# Patient Record
Sex: Male | Born: 1985 | Race: Black or African American | Hispanic: No | Marital: Single | Smoking: Current every day smoker
Health system: Southern US, Community
[De-identification: ages and names within clinical notes are randomized; demographics above are authoritative.]

## PROBLEM LIST (undated history)

## (undated) DIAGNOSIS — K219 Gastro-esophageal reflux disease without esophagitis: Secondary | ICD-10-CM

---

## 2013-12-06 ENCOUNTER — Encounter (HOSPITAL_COMMUNITY): Payer: Self-pay | Admitting: Emergency Medicine

## 2013-12-06 ENCOUNTER — Emergency Department (HOSPITAL_COMMUNITY)
Admission: EM | Admit: 2013-12-06 | Discharge: 2013-12-07 | Disposition: A | Discharge status: Home / Self Care | Payer: 59 | Attending: Emergency Medicine | Admitting: Emergency Medicine

## 2013-12-06 ENCOUNTER — Emergency Department (HOSPITAL_COMMUNITY)
Admission: EM | Admit: 2013-12-06 | Discharge: 2013-12-06 | Disposition: A | Discharge status: Home / Self Care | Payer: 59 | Source: Home / Self Care | Attending: Emergency Medicine | Admitting: Emergency Medicine

## 2013-12-06 DIAGNOSIS — R270 Ataxia, unspecified: Secondary | ICD-10-CM

## 2013-12-06 DIAGNOSIS — R109 Unspecified abdominal pain: Secondary | ICD-10-CM | POA: Diagnosis not present

## 2013-12-06 DIAGNOSIS — R1110 Vomiting, unspecified: Secondary | ICD-10-CM

## 2013-12-06 DIAGNOSIS — R1084 Generalized abdominal pain: Secondary | ICD-10-CM

## 2013-12-06 LAB — COMPREHENSIVE METABOLIC PANEL
ALBUMIN: 4.4 g/dL (ref 3.5–5.2)
ALK PHOS: 96 U/L (ref 39–117)
ALT: 20 U/L (ref 0–53)
ANION GAP: 14 (ref 5–15)
AST: 21 U/L (ref 0–37)
BILIRUBIN TOTAL: 1.5 mg/dL — AB (ref 0.3–1.2)
BUN: 7 mg/dL (ref 6–23)
CHLORIDE: 100 meq/L (ref 96–112)
CO2: 25 mEq/L (ref 19–32)
Calcium: 9.9 mg/dL (ref 8.4–10.5)
Creatinine, Ser: 0.74 mg/dL (ref 0.50–1.35)
GFR calc Af Amer: >90 mL/min (ref >90)
GFR calc non Af Amer: >90 mL/min (ref >90)
GLUCOSE: 91 mg/dL (ref 70–99)
POTASSIUM: 4.3 meq/L (ref 3.7–5.3)
SODIUM: 139 meq/L (ref 137–147)
TOTAL PROTEIN: 8.2 g/dL (ref 6.0–8.3)

## 2013-12-06 LAB — POCT I-STAT, CHEM 8
BUN: 5 mg/dL — ABNORMAL LOW (ref 6–23)
Calcium, Ion: 1.17 mmol/L (ref 1.12–1.23)
Chloride: 102 mEq/L (ref 96–112)
Creatinine, Ser: 0.8 mg/dL (ref 0.50–1.35)
Glucose, Bld: 88 mg/dL (ref 70–99)
HEMATOCRIT: 53 % — AB (ref 39.0–52.0)
HEMOGLOBIN: 18 g/dL — AB (ref 13.0–17.0)
POTASSIUM: 3.8 meq/L (ref 3.7–5.3)
SODIUM: 137 meq/L (ref 137–147)
TCO2: 28 mmol/L (ref 0–100)

## 2013-12-06 LAB — URINALYSIS, ROUTINE W REFLEX MICROSCOPIC
BILIRUBIN URINE: NEGATIVE
Glucose, UA: NEGATIVE mg/dL
HGB URINE DIPSTICK: NEGATIVE
Ketones, ur: 15 mg/dL — AB
Leukocytes, UA: NEGATIVE
Nitrite: NEGATIVE
Protein, ur: NEGATIVE mg/dL
SPECIFIC GRAVITY, URINE: 1.022 (ref 1.005–1.030)
Urobilinogen, UA: 0.2 mg/dL (ref 0.0–1.0)
pH: 7 (ref 5.0–8.0)

## 2013-12-06 LAB — CBC WITH DIFFERENTIAL/PLATELET
Basophils Absolute: 0 10*3/uL (ref 0.0–0.1)
Basophils Relative: 0 % (ref 0–1)
EOS ABS: 0 10*3/uL (ref 0.0–0.7)
Eosinophils Relative: 0 % (ref 0–5)
HCT: 47.1 % (ref 39.0–52.0)
HEMOGLOBIN: 16.3 g/dL (ref 13.0–17.0)
Lymphocytes Relative: 17 % (ref 12–46)
Lymphs Abs: 1.4 10*3/uL (ref 0.7–4.0)
MCH: 28.3 pg (ref 26.0–34.0)
MCHC: 34.6 g/dL (ref 30.0–36.0)
MCV: 81.8 fL (ref 78.0–100.0)
Monocytes Absolute: 0.5 10*3/uL (ref 0.1–1.0)
Monocytes Relative: 7 % (ref 3–12)
NEUTROS PCT: 76 % (ref 43–77)
Neutro Abs: 6.3 10*3/uL (ref 1.7–7.7)
PLATELETS: 252 10*3/uL (ref 150–400)
RBC: 5.76 MIL/uL (ref 4.22–5.81)
RDW: 13.4 % (ref 11.5–15.5)
WBC: 8.2 10*3/uL (ref 4.0–10.5)

## 2013-12-06 LAB — LIPASE, BLOOD: Lipase: 25 U/L (ref 11–59)

## 2013-12-06 MED ORDER — ONDANSETRON HCL 4 MG/2ML IJ SOLN
4.0000 mg | Freq: Once | INTRAMUSCULAR | Status: AC
  Administered 2013-12-06: 4 mg via INTRAVENOUS
  Filled 2013-12-06: qty 2

## 2013-12-06 MED ORDER — ONDANSETRON 4 MG PO TBDP
8.0000 mg | ORAL_TABLET | Freq: Once | ORAL | Status: AC
  Administered 2013-12-06: 8 mg via ORAL

## 2013-12-06 MED ORDER — GI COCKTAIL ~~LOC~~
30.0000 mL | Freq: Once | ORAL | Status: AC
  Administered 2013-12-06: 30 mL via ORAL
  Filled 2013-12-06: qty 30

## 2013-12-06 MED ORDER — ONDANSETRON 4 MG PO TBDP
ORAL_TABLET | ORAL | Status: AC
  Filled 2013-12-06: qty 1

## 2013-12-06 MED ORDER — SODIUM CHLORIDE 0.9 % IV BOLUS (SEPSIS)
1000.0000 mL | Freq: Once | INTRAVENOUS | Status: AC
  Administered 2013-12-06: 1000 mL via INTRAVENOUS

## 2013-12-06 NOTE — ED Notes (Addendum)
Pt sent here from [facility] for further eval of generalized abd pain. Having n/v denies diarrhea. Denies fever. Pt reports feeling drowsy and "drunk." pt reports pain started on Monday after taking over the recommended dosage of zzquil. Also having constipation. No acute distress noted at triage.

## 2013-12-06 NOTE — Discharge Instructions (Signed)
We have determined that your problem requires further evaluation in the emergency department.  We will take care of your transport there.  Once at the emergency department, you will be evaluated by a provider and they will order whatever treatment or tests they deem necessary.  We cannot guarantee that they will do any specific test or do any specific treatment.  ° °

## 2013-12-06 NOTE — ED Provider Notes (Signed)
Chief Complaint   Abdominal Pain   History of Present Illness   Donald Wagner is a 28 year old male who has had a five-day history of disequilibrium especially when he walks, nausea, vomiting, abdominal pain. The patient states that he has had insomnia has been taking Z Quill every night. He states that Monday he took a particularly large dose and has not taken any since then. Ever since then he's felt off balance, unsteady on his feet when he walks and like he is drunk. He denies any dizziness or vertigo. He's had no presyncope or syncope. The patient states that since Monday he's felt nauseated and vomited up everything he has eaten. He's also had multiple episodes of dry heaves and retching. Some of the vomitus looked red in color and he denies any bilious vomiting or coffee-ground emesis. Also since Monday the patient has had moderate intensity abdominal pain localized mostly to the left flank and right upper quadrant. He denies diarrhea, and fact he states his stools have been infrequent. He's tried some laxatives without much improvement. He has felt hot and sweaty. The entire left side of his body feels heavy but he denies any weakness or paresthesias. He's had no difficulty speaking or swallowing. He's had slight headache, light sensitivity, and blurry vision. He denies any diplopia. He's had some cough and feels somewhat short of breath at times. He denies any chest pain.  Review of Systems     Other than as noted above, the patient denies any of the following symptoms: Systemic:  No fever, chills, fatigue, myalgias, headache, or anorexia. Eye:  No redness, pain or drainage. ENT:  No earache, nasal congestion, rhinorrhea, sinus pressure, or sore throat. Lungs:  No cough, sputum production, wheezing, shortness of breath.  Cardiovascular:  No chest pain, palpitations, or syncope. GI:  No nausea, vomiting, abdominal pain or diarrhea. GU:  No dysuria, frequency, or hematuria. Skin:   No rash or pruritis.   PMFSH     Past medical history, family history, social history, meds, and allergies were reviewed.    Physical Examination    Vital signs:  BP 126/86  Pulse 80  Temp(Src) 98.1 F (36.7 C) (Oral)  Resp 16  SpO2 98% Orthostatic VS for the past 24 hrs:  BP- Lying Pulse- Lying BP- Sitting Pulse- Sitting BP- Standing at 0 minutes Pulse- Standing at 0 minutes  12/06/13 1738 139/83 mmHg 69 126/89 mmHg 68 130/91 mmHg 75   General:  Alert, in no distress. Eye:  PERRL, full EOMs.  Lids and conjunctivas were normal. ENT:  TMs and canals were normal, without erythema or inflammation.  Nasal mucosa was clear and uncongested, without drainage.  Mucous membranes were moist.  Pharynx was clear, without exudate or drainage.  There were no oral ulcerations or lesions. Neck:  Supple, no adenopathy, tenderness or mass. Thyroid was normal. Lungs:  No respiratory distress.  Lungs were clear to auscultation, without wheezes, rales or rhonchi.  Breath sounds were clear and equal bilaterally. Heart:  Regular rhythm, without gallops, murmers or rubs. Abdomen:  Soft, flat, with generalized tenderness to palpation without guarding or rebound.  No hepatosplenomagaly or mass. Bowel sounds were diminished. Skin:  Clear, warm, and dry, he has a petechial rash on his face, Neurological examination: The patient is alert and oriented x3. Speech is clear, fluent, and appropriate. Cranial nerves are intact. There is no pronator drift and finger to nose was normal. Muscle strength, sensation, and DTRs are normal. Babinskis are downgoing.  Station and gait were normal. Romberg sign is negative, he is unsteady on performance of tendon gait.   Labs    Results for orders placed during the hospital encounter of 12/06/13  POCT I-STAT, CHEM 8      Result Value Ref Range   Sodium 137  137 - 147 mEq/L   Potassium 3.8  3.7 - 5.3 mEq/L   Chloride 102  96 - 112 mEq/L   BUN 5 (*) 6 - 23 mg/dL   Creatinine,  Ser 1.610.80  0.50 - 1.35 mg/dL   Glucose, Bld 88  70 - 99 mg/dL   Calcium, Ion 0.961.17  0.451.12 - 1.23 mmol/L   TCO2 28  0 - 100 mmol/L   Hemoglobin 18.0 (*) 13.0 - 17.0 g/dL   HCT 40.953.0 (*) 81.139.0 - 91.452.0 %    Course in Urgent Care Center   The following medications were given:  Medications  ondansetron (ZOFRAN-ODT) disintegrating tablet 8 mg (8 mg Oral Given 12/06/13 1737)   Assessment   The primary encounter diagnosis was Intractable vomiting with nausea, vomiting of unspecified type. Diagnoses of Abdominal pain, generalized and Ataxia were also pertinent to this visit.  Differential diagnosis includes GI symptoms such as viral gastroenteritis, inflammatory bowel disease, diverticulitis, or pancreatitis. Also to be considered or neurological symptoms since he has prominent ataxia and is unsteady on tandem gait.  Plan     The patient was transferred to the ED via private vehicle in stable condition.  Medical Decision Making:  28 year old male with a five-day history of nausea and vomiting of all by mouth intake, generalized abdominal pain, ataxia, and his sensation in his left side feeling heavy. Physical examination is unremarkable with exception of a petechial rash on his face, generalized abdominal tenderness, and ataxia on tandem gait. Lab workup reveals a normal i-STAT 8 and normal orthostatic vital signs. Differential diagnosis is broad and you require further evaluation.         Donald Likesavid C Nazire Fruth, MD 12/06/13 [id number]

## 2013-12-06 NOTE — ED Notes (Signed)
C/o abd pain onset Monday Sx include hot flashes and vomiting Denies fevers and diarrhea Alert, no signs of acute distress.

## 2013-12-06 NOTE — ED Notes (Signed)
Declined shuttle service Mom will take pt Adv pt to go directly; NPO... Pt verbalized understanding.

## 2013-12-07 ENCOUNTER — Emergency Department (HOSPITAL_COMMUNITY): Payer: 59

## 2013-12-07 MED ORDER — ONDANSETRON 4 MG PO TBDP: 4.0000 mg | ORAL_TABLET | Freq: Three times a day (TID) | ORAL | Status: DC | PRN

## 2013-12-07 MED ORDER — OMEPRAZOLE 40 MG PO CPDR: 40.0000 mg | DELAYED_RELEASE_CAPSULE | Freq: Every day | ORAL | Status: DC

## 2013-12-07 NOTE — Discharge Instructions (Signed)

## 2013-12-07 NOTE — ED Notes (Signed)
Pt given water for a fluid challenge. 

## 2013-12-07 NOTE — ED Provider Notes (Signed)
TIME SEEN: 10:15 PM  CHIEF COMPLAINT: Abdominal pain, vomiting  HPI: Patient is a 28 y.o. M with no significant past medical history who presents to the emergency department with vomiting, abdominal fullness and feeling drowsy. Patient reports that he regularly takes Benadryl to help him sleep. He has been taking extra Benadryl over the past several days and states that he has been feeling more drowsy over the past few days. He states because of this he stopped taking the Benadryl but history as he has has not improved yet. He denies any other new ingestions. He is also had abdominal pain that he describes as feeling bloated and distended. He states that he feels very full. He has had multiple episodes of vomiting but no diarrhea. His last bowel movement was this morning and was normal. No bloody stools or melena. No fevers or chills. No sick contacts or recent travel. No antibiotic use. He states he has been vomiting forcefully recently and has facial petechiae secondary to this. No headache, neck pain or neck stiffness. No other rash.  ROS: See HPI Constitutional: no fever  Eyes: no drainage  ENT: no runny nose   Cardiovascular:  no chest pain  Resp: no SOB  GI:  vomiting GU: no dysuria Integumentary: no rash  Allergy: no hives  Musculoskeletal: no leg swelling  Neurological: no slurred speech ROS otherwise negative  PAST MEDICAL HISTORY/PAST SURGICAL HISTORY:  History reviewed. No pertinent past medical history.  MEDICATIONS:  Prior to Admission medications   Not on File    ALLERGIES:  No Known Allergies  SOCIAL HISTORY:  History  Substance Use Topics  . Smoking status: Never Smoker   . Smokeless tobacco: Not on file  . Alcohol Use: No    FAMILY HISTORY: History reviewed. No pertinent family history.  EXAM: BP 109/56  Pulse 57  Temp(Src) 97.9 F (36.6 C) (Oral)  Resp 16  SpO2 100% CONSTITUTIONAL: Alert and oriented and responds appropriately to questions.  Well-appearing; well-nourished, smiling, laughing, joking HEAD: Normocephalic EYES: Conjunctivae clear, PERRL ENT: normal nose; no rhinorrhea; moist mucous membranes; pharynx without lesions noted; petechiae noted on his face, no subconjunctival hemorrhage NECK: Supple, no meningismus, no LAD  CARD: RRR; S1 and S2 appreciated; no murmurs, no clicks, no rubs, no gallops RESP: Normal chest excursion without splinting or tachypnea; breath sounds clear and equal bilaterally; no wheezes, no rhonchi, no rales,  ABD/GI: Normal bowel sounds; non-distended; soft, non-tender, no rebound, no guarding; no peritoneal signs, negative Murphy sign, no tenderness at McBurney's point BACK:  The back appears normal and is non-tender to palpation, there is no CVA tenderness EXT: Normal ROM in all joints; non-tender to palpation; no edema; normal capillary refill; no cyanosis    SKIN: Normal color for age and race; warm; petechiae on his face but no other rash, no lesions on his palms or soles, no purpura NEURO: Moves all extremities equally PSYCH: The patient's mood and manner are appropriate. Grooming and personal hygiene are appropriate.  MEDICAL DECISION MAKING: Patient here with vomiting, feeling full and bloated. His abdomen is not distended. There is no tympany or fluid wave. His abdominal exam is benign. Labs ordered in triage are unremarkable. He has no thrombocytopenia. No electrolyte abnormality. LFTs show mildly elevated total bilirubin but otherwise unremarkable. Lipase normal. Urine shows no sign of infection. Will obtain acute abdominal series given his complaints of feeling full to evaluate for constipation, less likely small bowel obstruction. Will give GI cocktail, Zofran, IV fluids and  by mouth challenge.  ED PROGRESS: Patient's acute abdominal series shows no obstructive gas pattern, no free air. He is feeling much better after GI cocktail and Zofran. He states however he is still feeling drowsy.  Discussed with him this may be residual from recent increase in Benadryl intake. Discussed with him I recommend not taking this medication for several days. He has no anemia or electrolyte abnormality that could be causing the symptoms. He has been tolerating by mouth without difficulty. He reports abdominal pain and vomiting improved. No further vomiting in the ED. Will discharge with prescriptions for omeprazole and Zofran. Discussed supportive care instructions and return precautions. He verbalized understanding and is comfortable with plan.     Layla MawKristen N Leveda Kendrix, DO 12/07/13 [phone]

## 2015-07-02 ENCOUNTER — Emergency Department (HOSPITAL_COMMUNITY)
Admission: EM | Admit: 2015-07-02 | Discharge: 2015-07-02 | Disposition: A | Discharge status: Home / Self Care | Payer: Self-pay | Attending: Emergency Medicine | Admitting: Emergency Medicine

## 2015-07-02 ENCOUNTER — Encounter (HOSPITAL_COMMUNITY): Payer: Self-pay | Admitting: *Deleted

## 2015-07-02 ENCOUNTER — Emergency Department (HOSPITAL_COMMUNITY): Payer: Self-pay

## 2015-07-02 DIAGNOSIS — R1033 Periumbilical pain: Secondary | ICD-10-CM

## 2015-07-02 DIAGNOSIS — Z79899 Other long term (current) drug therapy: Secondary | ICD-10-CM | POA: Insufficient documentation

## 2015-07-02 DIAGNOSIS — K219 Gastro-esophageal reflux disease without esophagitis: Secondary | ICD-10-CM | POA: Insufficient documentation

## 2015-07-02 DIAGNOSIS — K429 Umbilical hernia without obstruction or gangrene: Secondary | ICD-10-CM | POA: Insufficient documentation

## 2015-07-02 HISTORY — DX: Gastro-esophageal reflux disease without esophagitis: K21.9

## 2015-07-02 LAB — CBC
HCT: 47.5 % (ref 39.0–52.0)
HEMOGLOBIN: 16.3 g/dL (ref 13.0–17.0)
MCH: 27.7 pg (ref 26.0–34.0)
MCHC: 34.3 g/dL (ref 30.0–36.0)
MCV: 80.8 fL (ref 78.0–100.0)
PLATELETS: 292 10*3/uL (ref 150–400)
RBC: 5.88 MIL/uL — AB (ref 4.22–5.81)
RDW: 13.4 % (ref 11.5–15.5)
WBC: 16.4 10*3/uL — ABNORMAL HIGH (ref 4.0–10.5)

## 2015-07-02 LAB — COMPREHENSIVE METABOLIC PANEL
ALBUMIN: 4.2 g/dL (ref 3.5–5.0)
ALK PHOS: 85 U/L (ref 38–126)
ALT: 33 U/L (ref 17–63)
AST: 32 U/L (ref 15–41)
Anion gap: 12 (ref 5–15)
BUN: 10 mg/dL (ref 6–20)
CALCIUM: 10.1 mg/dL (ref 8.9–10.3)
CHLORIDE: 101 mmol/L (ref 101–111)
CO2: 21 mmol/L — AB (ref 22–32)
CREATININE: 1.05 mg/dL (ref 0.61–1.24)
GFR calc Af Amer: >60 mL/min (ref >60)
GFR calc non Af Amer: >60 mL/min (ref >60)
GLUCOSE: 155 mg/dL — AB (ref 65–99)
Potassium: 4.4 mmol/L (ref 3.5–5.1)
SODIUM: 134 mmol/L — AB (ref 135–145)
Total Bilirubin: 1 mg/dL (ref 0.3–1.2)
Total Protein: 8 g/dL (ref 6.5–8.1)

## 2015-07-02 LAB — LIPASE, BLOOD: LIPASE: 24 U/L (ref 11–51)

## 2015-07-02 LAB — URINALYSIS, ROUTINE W REFLEX MICROSCOPIC
Bilirubin Urine: NEGATIVE
GLUCOSE, UA: NEGATIVE mg/dL
HGB URINE DIPSTICK: NEGATIVE
Ketones, ur: NEGATIVE mg/dL
Leukocytes, UA: NEGATIVE
Nitrite: NEGATIVE
Protein, ur: NEGATIVE mg/dL
pH: 7 (ref 5.0–8.0)

## 2015-07-02 MED ORDER — METOCLOPRAMIDE HCL 5 MG/ML IJ SOLN
5.0000 mg | Freq: Once | INTRAMUSCULAR | Status: AC
  Administered 2015-07-02: 5 mg via INTRAVENOUS
  Filled 2015-07-02: qty 2

## 2015-07-02 MED ORDER — HYDROMORPHONE HCL 1 MG/ML IJ SOLN
1.0000 mg | Freq: Once | INTRAMUSCULAR | Status: AC
Start: 2015-07-02 — End: 2015-07-02
  Administered 2015-07-02: 1 mg via INTRAVENOUS
  Filled 2015-07-02: qty 1

## 2015-07-02 MED ORDER — IOPAMIDOL (ISOVUE-300) INJECTION 61%
INTRAVENOUS | Status: AC
  Administered 2015-07-02: 100 mL
  Filled 2015-07-02: qty 100

## 2015-07-02 MED ORDER — SODIUM CHLORIDE 0.9 % IV BOLUS (SEPSIS)
2000.0000 mL | Freq: Once | INTRAVENOUS | Status: AC
  Administered 2015-07-02: 2000 mL via INTRAVENOUS

## 2015-07-02 NOTE — ED Provider Notes (Signed)
CSN: [id number]     Arrival date & time 07/02/15  [id number] History   First MD Initiated Contact with Patient 07/02/15 0930     Chief Complaint  Patient presents with  . Abdominal Pain     (Consider location/radiation/quality/duration/timing/severity/associated sxs/prior Treatment) HPI Complains of periumbilical abdominal pain gradual onset 2 AM today nonradiating, severe and sharp. Associated symptoms include nausea and vomiting 4 episodes since onset of pain though not nauseated at present. Last bowel movement 8 AM today, normal. No known fever. No urinary symptoms. No other associated symptoms. Treated with Prilosec, without relief. Past Medical History  Diagnosis Date  . Acid reflux    History reviewed. No pertinent past surgical history. History reviewed. No pertinent family history. Past surgical history negative Social History  Substance Use Topics  . Smoking status: Never Smoker   . Smokeless tobacco: None  . Alcohol Use: No  Cigar smoker, occasional alcohol no illicit drug use  Review of Systems  Constitutional: Negative.   HENT: Negative.   Respiratory: Negative.   Cardiovascular: Negative.   Gastrointestinal: Positive for nausea, vomiting and abdominal pain.  Musculoskeletal: Negative.   Skin: Negative.   Neurological: Negative.   Psychiatric/Behavioral: Negative.   All other systems reviewed and are negative.     Allergies  Bee venom  Home Medications   Prior to Admission medications   Medication Sig Start Date End Date Taking? Authorizing Provider  omeprazole (PRILOSEC) 40 MG capsule Take 1 capsule (40 mg total) by mouth daily. 12/07/13  Yes Kristen N Ward, DO  ondansetron (ZOFRAN ODT) 4 MG disintegrating tablet Take 1 tablet (4 mg total) by mouth every 8 (eight) hours as needed for nausea or vomiting. 12/07/13   Kristen N Ward, DO   BP 146/97 mmHg  Pulse 92  Temp(Src) 97.9 F (36.6 C)  Resp 21  Ht 5\' 8"  (1.727 m)  Wt 275 lb (124.739 kg)  BMI 41.82  kg/m2  SpO2 99% Physical Exam  Constitutional: He appears well-developed and well-nourished. He appears distressed.  Appears uncomfortable  HENT:  Head: Normocephalic and atraumatic.  Eyes: Conjunctivae are normal. Pupils are equal, round, and reactive to light.  Neck: Neck supple. No tracheal deviation present. No thyromegaly present.  Cardiovascular: Normal rate and regular rhythm.   No murmur heard. Pulmonary/Chest: Effort normal and breath sounds normal.  Abdominal: Soft. Bowel sounds are normal. He exhibits no distension and no mass. There is tenderness.  Obese Periumbilical tenderness  Genitourinary: Penis normal.  Scrotum normal  Musculoskeletal: Normal range of motion. He exhibits no edema or tenderness.  Neurological: He is alert. Coordination normal.  Skin: Skin is warm and dry. No rash noted.  Psychiatric: He has a normal mood and affect.  Nursing note and vitals reviewed.   ED Course  Procedures (including critical care time) Labs Review Labs Reviewed  LIPASE, BLOOD  COMPREHENSIVE METABOLIC PANEL  CBC  URINALYSIS, ROUTINE W REFLEX MICROSCOPIC (NOT AT [facility])    Imaging Review No results found. I have personally reviewed and evaluated these images and lab results as part of my medical decision-making.   EKG Interpretation None     12:20 PM patient resting comfortably after treatment with intravenous fluids, intravenous Reglan and intravenous opioids. Abdomen is soft and nontender. Reexamined abdomen. No external evidence of hernia  1:50 PM pain almost gone. He denies any nausea. Results for orders placed or performed during the hospital encounter of 07/02/15  Lipase, blood  Result Value Ref Range   Lipase 24 11 -  51 U/L  Comprehensive metabolic panel  Result Value Ref Range   Sodium 134 (L) 135 - 145 mmol/L   Potassium 4.4 3.5 - 5.1 mmol/L   Chloride 101 101 - 111 mmol/L   CO2 21 (L) 22 - 32 mmol/L   Glucose, Bld 155 (H) 65 - 99 mg/dL   BUN 10 6 - 20  mg/dL   Creatinine, Ser 1.61 0.61 - 1.24 mg/dL   Calcium 09.6 8.9 - 04.5 mg/dL   Total Protein 8.0 6.5 - 8.1 g/dL   Albumin 4.2 3.5 - 5.0 g/dL   AST 32 15 - 41 U/L   ALT 33 17 - 63 U/L   Alkaline Phosphatase 85 38 - 126 U/L   Total Bilirubin 1.0 0.3 - 1.2 mg/dL   GFR calc non Af Amer >60 >60 mL/min   GFR calc Af Amer >60 >60 mL/min   Anion gap 12 5 - 15  CBC  Result Value Ref Range   WBC 16.4 (H) 4.0 - 10.5 K/uL   RBC 5.88 (H) 4.22 - 5.81 MIL/uL   Hemoglobin 16.3 13.0 - 17.0 g/dL   HCT 40.9 81.1 - 91.4 %   MCV 80.8 78.0 - 100.0 fL   MCH 27.7 26.0 - 34.0 pg   MCHC 34.3 30.0 - 36.0 g/dL   RDW 78.2 95.6 - 21.3 %   Platelets 292 150 - 400 K/uL  Urinalysis, Routine w reflex microscopic  Result Value Ref Range   Color, Urine YELLOW YELLOW   APPearance CLEAR CLEAR   Specific Gravity, Urine <1.005 (L) 1.005 - 1.030   pH 7.0 5.0 - 8.0   Glucose, UA NEGATIVE NEGATIVE mg/dL   Hgb urine dipstick NEGATIVE NEGATIVE   Bilirubin Urine NEGATIVE NEGATIVE   Ketones, ur NEGATIVE NEGATIVE mg/dL   Protein, ur NEGATIVE NEGATIVE mg/dL   Nitrite NEGATIVE NEGATIVE   Leukocytes, UA NEGATIVE NEGATIVE   Ct Abdomen Pelvis W Contrast  07/02/2015  CLINICAL DATA:  Abdominal pain, umbilical pain starting 4 a.m. this morning EXAM: CT ABDOMEN AND PELVIS WITH CONTRAST TECHNIQUE: Multidetector CT imaging of the abdomen and pelvis was performed using the standard protocol following bolus administration of intravenous contrast. CONTRAST:  ISOVUE-300 IOPAMIDOL (ISOVUE-300) INJECTION 61% COMPARISON:  None. FINDINGS: Lower chest:  The lung bases are unremarkable. Hepatobiliary: Enhanced liver is unremarkable. No calcified gallstones are noted within gallbladder. Pancreas: Enhanced pancreas is unremarkable. Spleen: Enhanced spleen is unremarkable. Adrenals/Urinary Tract: No adrenal gland mass. Enhanced kidneys are symmetrical in size. No hydronephrosis or hydroureter. There is a exophytic cyst in lower pole of the  right kidney measures 1.2 cm. No calcified ureteral calculi are noted. No calcified calculi are noted within urinary bladder. There is a tubular structure in anterior superior aspect of the urinary bladder measures about 1.5 cm in length there is some central fat. This is best seen in sagittal image 80. 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 There is no evidence of acute complication. Stomach/Bowel: There is no gastric outlet obstruction. No thickened or dilated small bowel loops. The terminal ileum is unremarkable. There is some colonic stool and moderate gas within cecum. No pericecal inflammation. Normal appendix is partially visualized in axial image 65. Moderate stool and gas noted within transverse colon. The left colon is empty partially collapsed. Some colonic gas and stool noted mid sigmoid colon. No distal colonic obstruction. No evidence of colitis or diverticulitis. No small bowel obstruction. Vascular/Lymphatic: No aortic aneurysm. No retroperitoneal or mesenteric adenopathy. Reproductive: No pelvic  mass. Prostate gland and seminal vesicles are unremarkable. Other: No ascites or free air. There is small umbilical hernia containing fat measures 1.6 cm without evidence of acute complication. Musculoskeletal: No destructive bony lesions are noted. Sagittal images of the spine are unremarkable. No destructive bony lesions are noted within pelvis. IMPRESSION: 1. There is no evidence of acute inflammatory process within abdomen. 2. No hydronephrosis or hydroureter. 3. There is a tubular structure in anterior superior aspect of the urinary bladder measures about 1.5 cm in length there is some central fat. This is best seen in sagittal image 80. 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 There is no evidence of acute complication. 4. Small umbilical hernia containing fat without evidence acute complication. 5. Normal appendix. No pericecal inflammation. Moderate stool and  gas noted in right colon and transverse colon. No evidence of colitis or diverticulitis. 6. No small bowel obstruction. Electronically Signed   By: Natasha Mead M.D.   On: 07/02/2015 11:03    MDM  Plan clear liquid diet for 24 hours. Pain is felt to be nonspecific. Patient does not require emergent surgery for hernia. He'll be referred to [facility] surgery if abdominal pain persists. Diagnosis #1 abdominal pain #2 nausea and vomiting #3 umbilical hernia Final diagnoses:  None        Doug Sou, MD 07/02/15 1357

## 2015-07-02 NOTE — ED Notes (Signed)
Pt states he understands instructions. Departs walking with steady gait.

## 2015-07-02 NOTE — ED Notes (Signed)
PT reports ABD pain started @ 0200 . Pt reports vomiting x4 since 0200. Pt denies diarrhea.

## 2015-07-02 NOTE — Discharge Instructions (Signed)
Hernia Stay on clear liquids for the next 24 hours, to include juices ,Jell-O ,broth or water. CT scan of your abdomen shows a [name] have a small hernia at your umbilicus (bellybutton) if pain continues call [name] [facility] to arrange for elective surgery. If the area around your belly button becomes more painful, red, hot or swollen or you have persistent vomiting, return to the emergency department. It is okay to take Tylenol as directed for pain A hernia happens when an organ or tissue inside your body pushes out through a weak spot in the belly (abdomen). HOME CARE  Avoid stretching or overusing (straining) the muscles near the hernia.  Do not lift anything heavier than 10 lb (4.5 kg).  Use the muscles in your leg when you lift something up. Do not use the muscles in your back.  When you cough, try to cough gently.  Eat a diet that has a lot of fiber. Eat lots of fruits and vegetables.  Drink enough fluids to keep your pee (urine) clear or pale yellow. Try to drink 6-8 glasses of water a day.  Take medicines to make your poop soft (stool softeners) as told by your doctor.  Lose weight, if you are overweight.  Do not use any tobacco products, including cigarettes, chewing tobacco, or electronic cigarettes. If you need help quitting, ask your doctor.  Keep all follow-up visits as told by your doctor. This is important. GET HELP IF:  The skin by the hernia gets puffy (swollen) or red.  The hernia is painful. GET HELP RIGHT AWAY IF:  You have a fever.  You have belly pain that is getting worse.  You feel sick to your stomach (nauseous) or you throw up (vomit).  You cannot push the hernia back in place by gently pressing on it while you are lying down.  The hernia:  Changes in shape or size.  Is stuck outside your belly.  Changes color.  Feels hard or tender.   This information is not intended to replace advice given to you by your health care provider. Make  sure you discuss any questions you have with your health care provider.   Document Released: 07/14/2009 Document Revised: 02/14/2014 Document Reviewed: 12/04/2013 Elsevier Interactive Patient Education [employer]2016 [employer]

## 2015-07-27 ENCOUNTER — Encounter (HOSPITAL_COMMUNITY): Payer: Self-pay

## 2015-07-27 ENCOUNTER — Emergency Department (HOSPITAL_COMMUNITY)
Admission: EM | Admit: 2015-07-27 | Discharge: 2015-07-27 | Disposition: A | Discharge status: Home / Self Care | Payer: Self-pay | Attending: Emergency Medicine | Admitting: Emergency Medicine

## 2015-07-27 ENCOUNTER — Emergency Department (HOSPITAL_COMMUNITY): Payer: Self-pay

## 2015-07-27 DIAGNOSIS — J069 Acute upper respiratory infection, unspecified: Secondary | ICD-10-CM

## 2015-07-27 DIAGNOSIS — R000 Tachycardia, unspecified: Secondary | ICD-10-CM | POA: Insufficient documentation

## 2015-07-27 DIAGNOSIS — J029 Acute pharyngitis, unspecified: Secondary | ICD-10-CM | POA: Insufficient documentation

## 2015-07-27 LAB — RAPID STREP SCREEN (MED CTR MEBANE ONLY): Streptococcus, Group A Screen (Direct): NEGATIVE

## 2015-07-27 MED ORDER — IBUPROFEN 600 MG PO TABS: 600.0000 mg | ORAL_TABLET | Freq: Four times a day (QID) | ORAL | Status: DC | PRN

## 2015-07-27 MED ORDER — DEXAMETHASONE SODIUM PHOSPHATE 10 MG/ML IJ SOLN
10.0000 mg | Freq: Once | INTRAMUSCULAR | Status: AC
  Administered 2015-07-27: 10 mg via INTRAMUSCULAR
  Filled 2015-07-27: qty 1

## 2015-07-27 NOTE — ED Notes (Signed)
Pt departed in NAD.  

## 2015-07-27 NOTE — ED Provider Notes (Signed)
CSN: [id number]     Arrival date & time 07/27/15  0214 History   By signing my name below, I, [facility] Washington, attest that this documentation has been prepared under the direction and in the presence of Derwood KaplanAnkit Rasheida Broden, MD. Electronically Signed: Randell PatientMarrissa Washington, ED Scribe. 07/27/2015. 12:52 PM.    Chief Complaint  Patient presents with  . Sore Throat  . Otalgia    The history is provided by the patient. No language interpreter was used.   HPI Comments: Donald Wagner is a 30 y.o. male with an hx of GERD who presents to the Emergency Department complaining of constant, moderate, gradually worsening sore throat onset 1 week ago. Pt states that his symptoms began with a cough and nasal congestion followed by bilateral ear pain and sore throat. He reports associated chills, appetite loss, increased salivation that he has to spit out, and that he has been spitting up liquids shortly after drinking them. He has taken Mucinex without relief. Denies having a PCP currently. Denies illicit drug use of ETOH abuse. Denies hx of throat surgeries. Denies fevers any other symptoms currently.   Past Medical History  Diagnosis Date  . Acid reflux    History reviewed. No pertinent past surgical history. History reviewed. No pertinent family history. Social History  Substance Use Topics  . Smoking status: Never Smoker   . Smokeless tobacco: None  . Alcohol Use: No    Review of Systems A complete 10 system review of systems was obtained and all systems are negative except as noted in the HPI and PMH.    Allergies  Bee venom and Lactose intolerance (gi)  Home Medications   Prior to Admission medications   Medication Sig Start Date End Date Taking? Authorizing Provider  guaiFENesin (MUCINEX) 600 MG 12 hr tablet Take 600 mg by mouth 2 (two) times daily as needed for cough.   Yes Historical Provider, MD  ibuprofen (ADVIL,MOTRIN) 600 MG tablet Take 1 tablet (600 mg total) by mouth every  6 (six) hours as needed. 07/27/15   Derwood KaplanAnkit Octaviano Mukai, MD  omeprazole (PRILOSEC) 40 MG capsule Take 1 capsule (40 mg total) by mouth daily. Patient not taking: Reported on 07/27/2015 12/07/13   Kristen N Ward, DO   BP 139/101 mmHg  Pulse 95  Temp(Src) 98.6 F (37 C) (Oral)  Resp 16  SpO2 96% Physical Exam  Constitutional: He is oriented to person, place, and time. He appears well-developed and well-nourished. No distress.  HENT:  Head: Normocephalic and atraumatic.  Mouth/Throat: Oropharyngeal exudate and posterior oropharyngeal edema present.  No oral mucosa swelling. Bilateral peritonsillar swelling with exudates. Tonsils are not touching each other.  Eyes: Conjunctivae are normal.  Neck: Normal range of motion.  Mild cervical adenopathy.  Cardiovascular: Tachycardia present.   No murmur heard. Regular rhythm but tachycardic.  Pulmonary/Chest: Effort normal. No stridor. No respiratory distress.  Lungs CTA bilaterally.  Musculoskeletal: Normal range of motion.  Lymphadenopathy:    He has cervical adenopathy.  Neurological: He is alert and oriented to person, place, and time.  Skin: Skin is warm and dry.  Psychiatric: He has a normal mood and affect. His behavior is normal.  Nursing note and vitals reviewed.   ED Course  Procedures   DIAGNOSTIC STUDIES: Oxygen Saturation is 97% on RA, normal by my interpretation.    COORDINATION OF CARE: 3:47 AM Discussed treatment plan with pt at bedside and pt agreed to plan.   Labs Review Labs Reviewed  RAPID STREP SCREEN (  NOT AT [facility])  CULTURE, GROUP A STREP [facility])    Imaging Review No results found. I have personally reviewed and evaluated these images and lab results as part of my medical decision-making.   EKG Interpretation None      MDM   Final diagnoses:  Acute pharyngitis, unspecified etiology  URI, acute    I personally performed the services described in this documentation, which was scribed in my presence. The  recorded information has been reviewed and is accurate.  Pt with throat pain. Rapid strep is neg. No hard signs for deep infection causing resp distress, except for severe dysphagia. Xrays soft tissue neck is neg. Will d/c.   Derwood Kaplan, MD 08/03/15 1253

## 2015-07-27 NOTE — ED Notes (Signed)
Pt complaining of ear and throat pain. Pt states painful to swallow x 1 week. Pt states dry cough.

## 2015-07-27 NOTE — Discharge Instructions (Signed)
Your strep test and Xray of the neck are normal. Please take motrin round the clock. Return to the ER if you have difficulty in breathing, drooling, severe pain, fevers, severe headaches.   Pharyngitis Pharyngitis is redness, pain, and swelling (inflammation) of your pharynx.  CAUSES  Pharyngitis is usually caused by infection. Most of the time, these infections are from viruses (viral) and are part of a cold. However, sometimes pharyngitis is caused by bacteria (bacterial). Pharyngitis can also be caused by allergies. Viral pharyngitis may be spread from person to person by coughing, sneezing, and personal items or utensils (cups, forks, spoons, toothbrushes). Bacterial pharyngitis may be spread from person to person by more intimate contact, such as kissing.  SIGNS AND SYMPTOMS  Symptoms of pharyngitis include:   Sore throat.   Tiredness (fatigue).   Low-grade fever.   Headache.  Joint pain and muscle aches.  Skin rashes.  Swollen lymph nodes.  Plaque-like film on throat or tonsils (often seen with bacterial pharyngitis). DIAGNOSIS  Your health care provider will ask you questions about your illness and your symptoms. Your medical history, along with a physical exam, is often all that is needed to diagnose pharyngitis. Sometimes, a rapid strep test is done. Other lab tests may also be done, depending on the suspected cause.  TREATMENT  Viral pharyngitis will usually get better in 3-4 days without the use of medicine. Bacterial pharyngitis is treated with medicines that kill germs (antibiotics).  HOME CARE INSTRUCTIONS   Drink enough water and fluids to keep your urine clear or pale yellow.   Only take over-the-counter or prescription medicines as directed by your health care provider:   If you are prescribed antibiotics, make sure you finish them even if you start to feel better.   Do not take aspirin.   Get lots of rest.   Gargle with 8 oz of salt water ( tsp  of salt per 1 qt of water) as often as every 1-2 hours to soothe your throat.   Throat lozenges (if you are not at risk for choking) or sprays may be used to soothe your throat. SEEK MEDICAL CARE IF:   You have large, tender lumps in your neck.  You have a rash.  You cough up green, yellow-brown, or bloody spit. SEEK IMMEDIATE MEDICAL CARE IF:   Your neck becomes stiff.  You drool or are unable to swallow liquids.  You vomit or are unable to keep medicines or liquids down.  You have severe pain that does not go away with the use of recommended medicines.  You have trouble breathing (not caused by a stuffy nose). MAKE SURE YOU:   Understand these instructions.  Will watch your condition.  Will get help right away if you are not doing well or get worse.   This information is not intended to replace advice given to you by your health care provider. Make sure you discuss any questions you have with your health care provider.   Document Released: 01/24/2005 Document Revised: 11/14/2012 Document Reviewed: 10/01/2012 Elsevier Interactive Patient Education [employer]

## 2015-07-29 LAB — CULTURE, GROUP A STREP (THRC)

## 2015-11-24 IMAGING — CR DG ABDOMEN ACUTE W/ 1V CHEST
4 series · 4 of 4 positions shown · non-contrast
Comparison: None.

CLINICAL DATA: Vomiting for 1 week.

EXAM:
ACUTE ABDOMEN SERIES (ABDOMEN 2 VIEW & CHEST 1 VIEW)

[w chest pa]
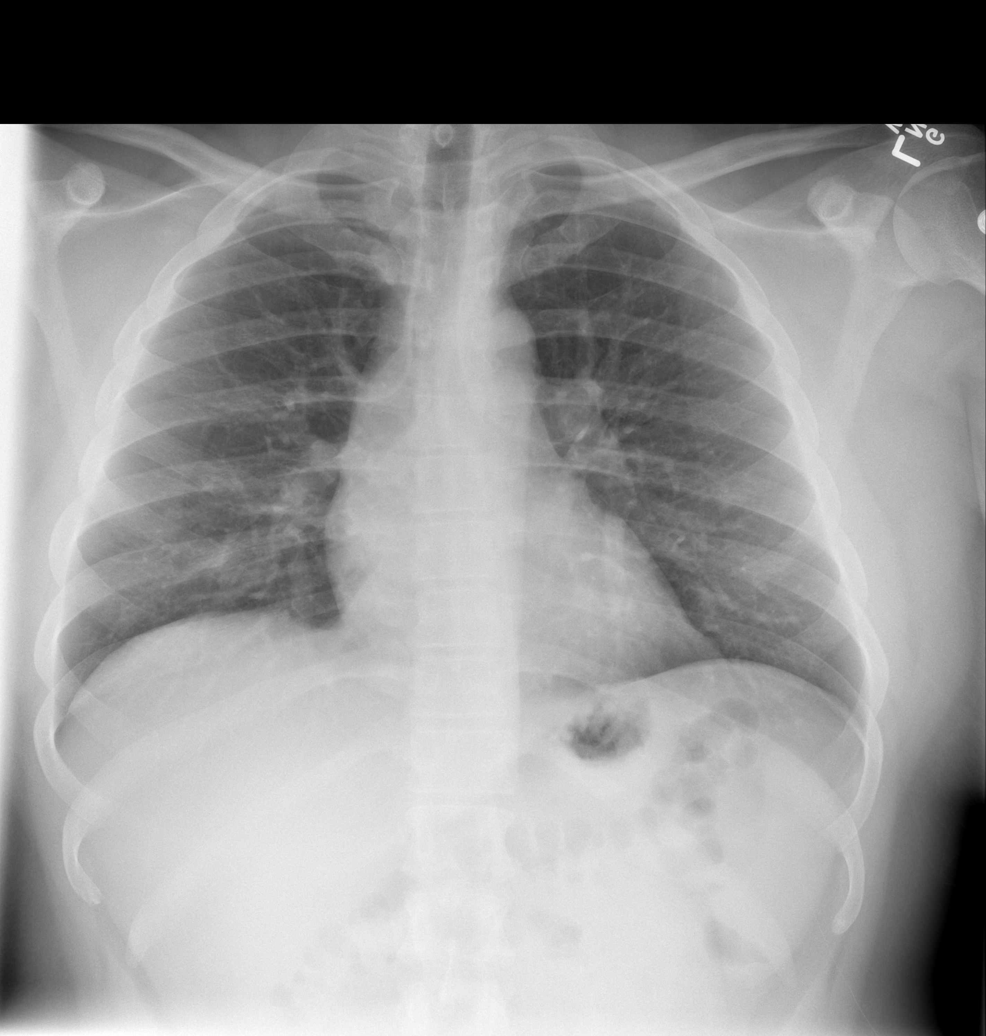

[w abdomen upright]
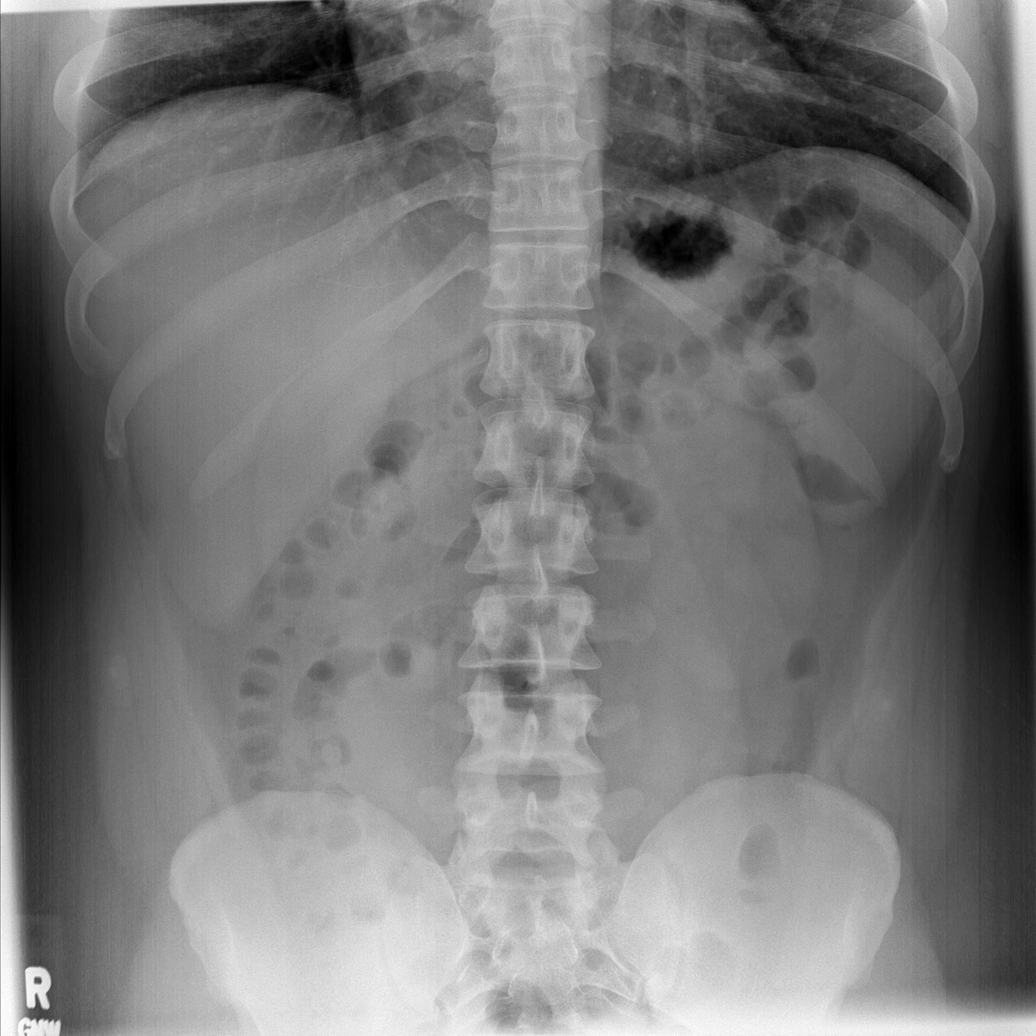

[t abdomen supine (1 of 2)]
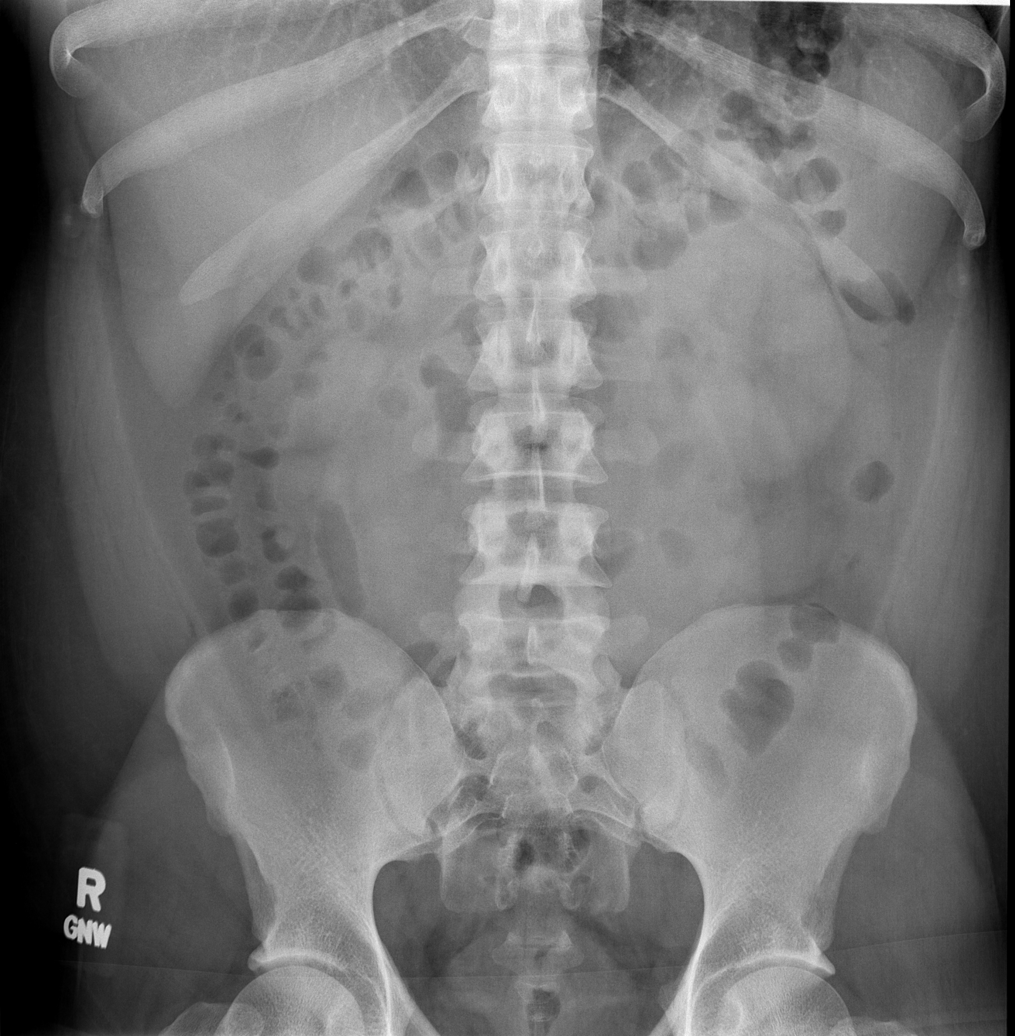

[t abdomen supine (2 of 2)]
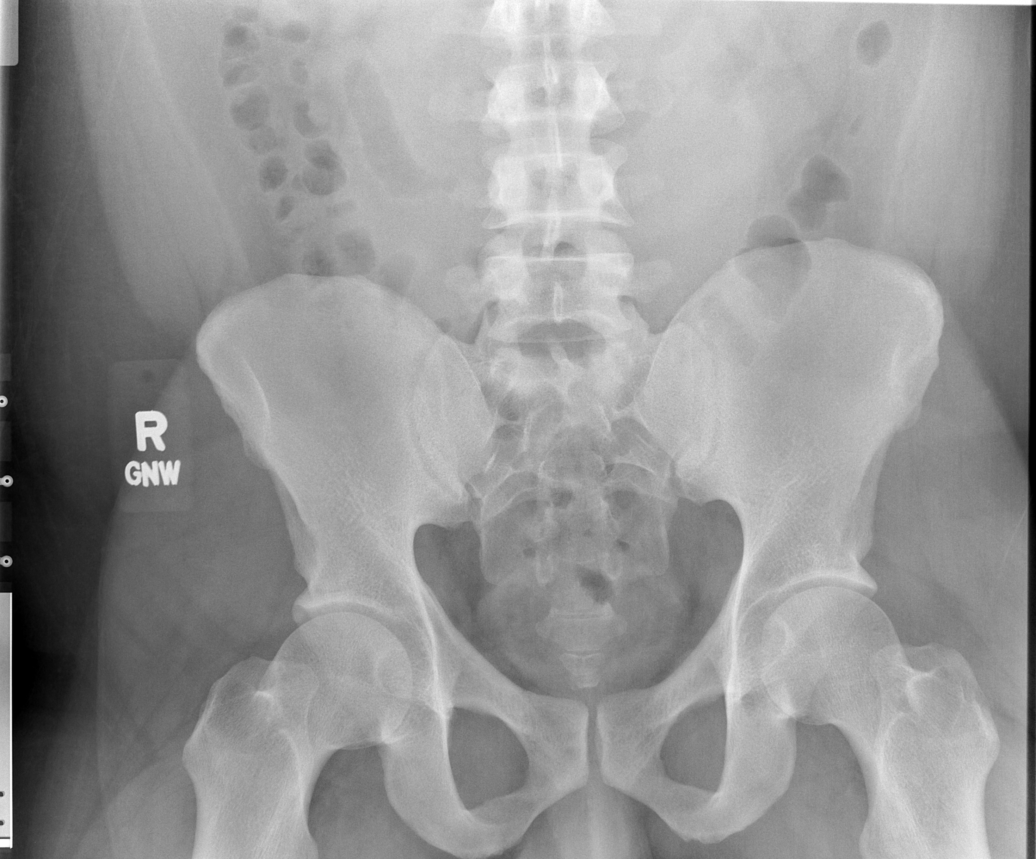

[4 of 4 positions shown; findings below may reference images not displayed]

FINDINGS: There is no evidence of dilated bowel loops or free intraperitoneal
air. No radiopaque calculi or other significant radiographic
abnormality is seen. Heart size and mediastinal contours are within
normal limits. Both lungs are clear.
IMPRESSION: Benign appearing abdomen and chest.

## 2017-07-14 IMAGING — CR DG NECK SOFT TISSUE
2 series · 2 of 2 positions shown · non-contrast
Comparison: None.

CLINICAL DATA: Initial evaluation for acute sore throat. NT years.
Cough.

EXAM:
NECK SOFT TISSUES - 1+ VIEW

[neck lat]
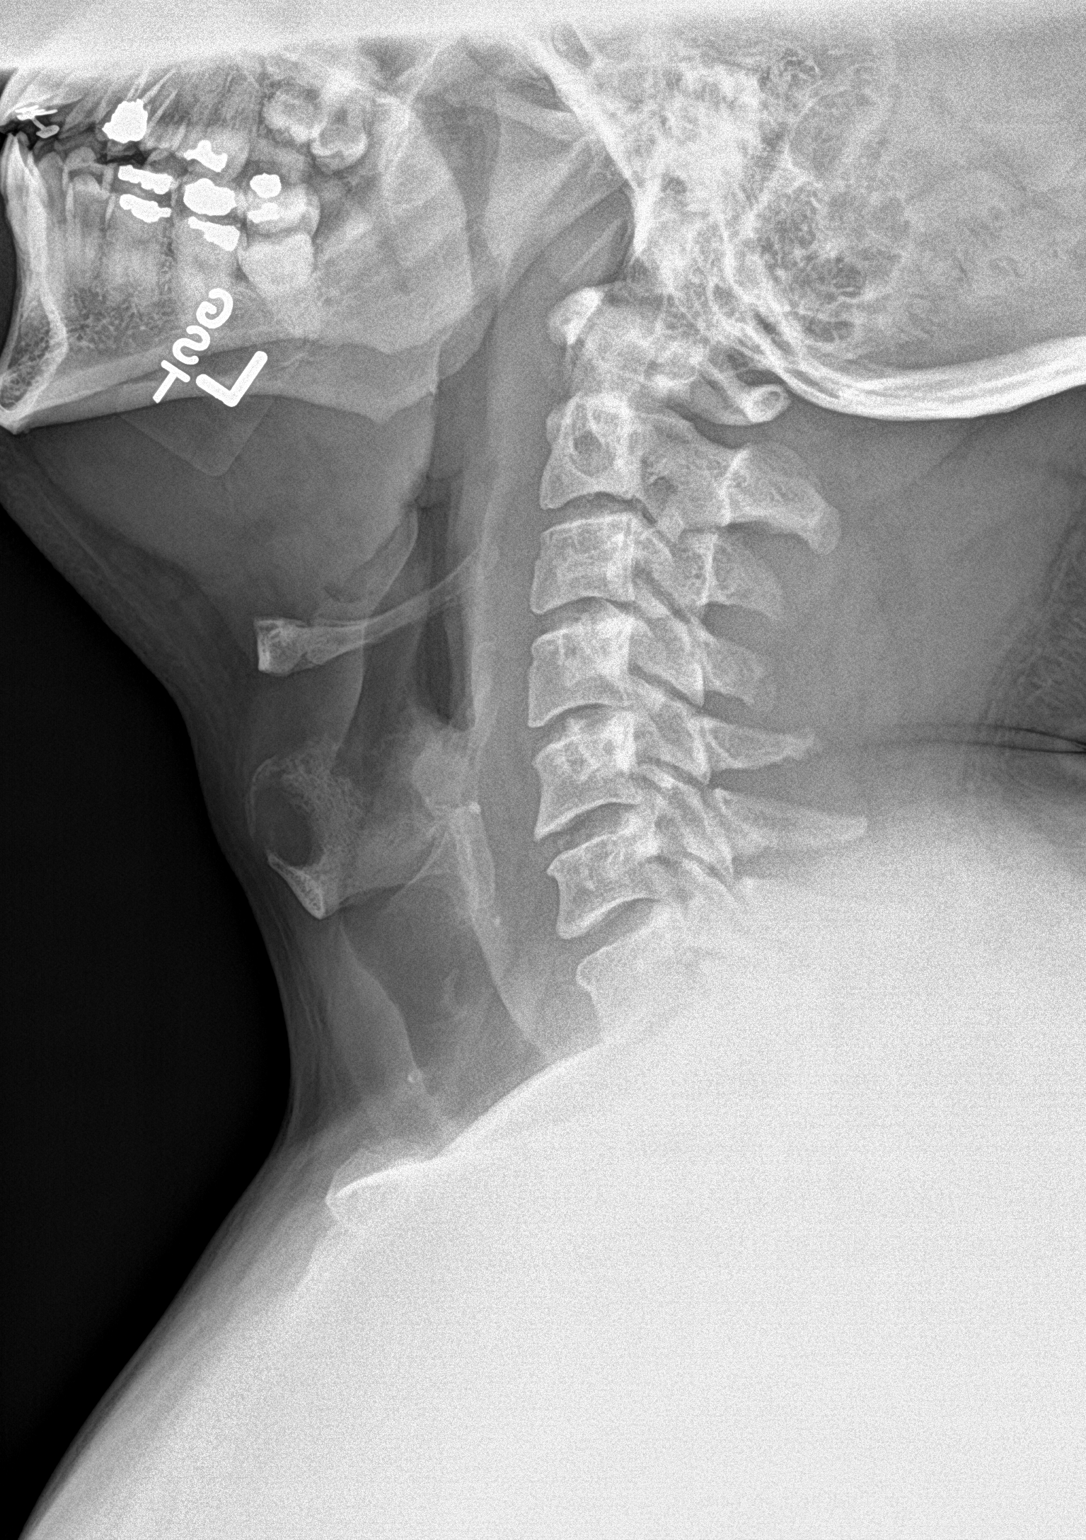

[neck ap]
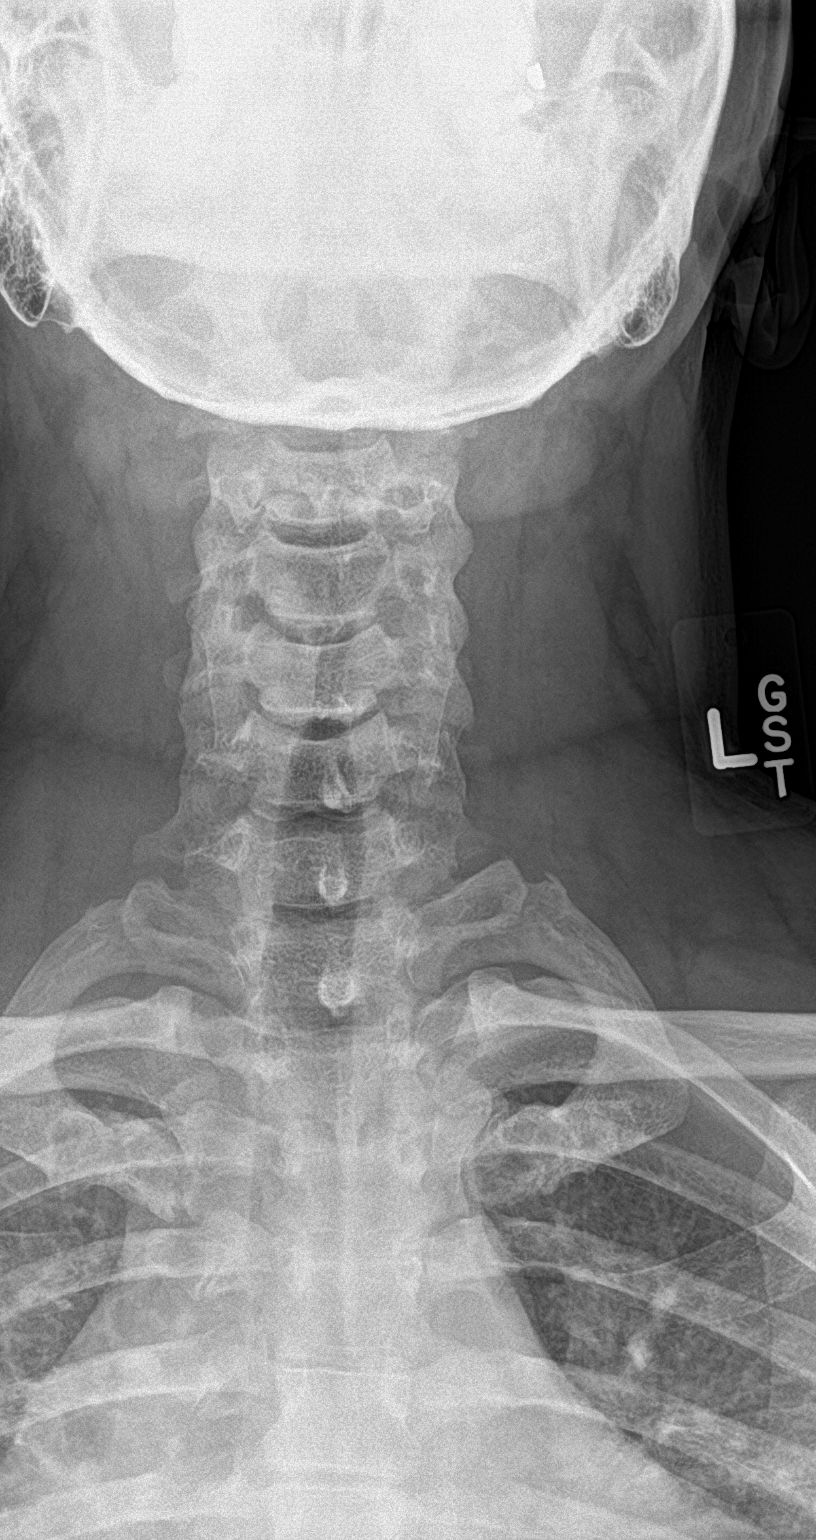

[2 of 2 positions shown; findings below may reference images not displayed]

FINDINGS: There is no evidence of retropharyngeal soft tissue swelling or
epiglottic enlargement. The cervical airway is unremarkable and no
radio-opaque foreign body identified.
IMPRESSION: No radiographic evidence for active infection within the neck.

## 2017-07-26 ENCOUNTER — Emergency Department (HOSPITAL_COMMUNITY)
Admission: EM | Admit: 2017-07-26 | Discharge: 2017-07-27 | Disposition: A | Discharge status: Home / Self Care | Payer: Self-pay | Attending: Emergency Medicine | Admitting: Emergency Medicine

## 2017-07-26 ENCOUNTER — Encounter (HOSPITAL_COMMUNITY): Payer: Self-pay | Admitting: Emergency Medicine

## 2017-07-26 ENCOUNTER — Emergency Department (HOSPITAL_COMMUNITY): Payer: Self-pay

## 2017-07-26 ENCOUNTER — Other Ambulatory Visit: Payer: Self-pay

## 2017-07-26 DIAGNOSIS — S61211A Laceration without foreign body of left index finger without damage to nail, initial encounter: Secondary | ICD-10-CM | POA: Insufficient documentation

## 2017-07-26 DIAGNOSIS — W230XXA Caught, crushed, jammed, or pinched between moving objects, initial encounter: Secondary | ICD-10-CM | POA: Insufficient documentation

## 2017-07-26 DIAGNOSIS — Y990 Civilian activity done for income or pay: Secondary | ICD-10-CM | POA: Insufficient documentation

## 2017-07-26 DIAGNOSIS — Y939 Activity, unspecified: Secondary | ICD-10-CM | POA: Insufficient documentation

## 2017-07-26 DIAGNOSIS — Y929 Unspecified place or not applicable: Secondary | ICD-10-CM | POA: Insufficient documentation

## 2017-07-26 MED ORDER — BUPIVACAINE HCL (PF) 0.5 % IJ SOLN
10.0000 mL | Freq: Once | INTRAMUSCULAR | Status: DC
Start: 2017-07-26 — End: 2017-07-27
  Filled 2017-07-26: qty 30

## 2017-07-26 MED ORDER — LIDOCAINE HCL (PF) 2 % IJ SOLN
5.0000 mL | Freq: Once | INTRAMUSCULAR | Status: AC
  Administered 2017-07-26: 5 mL

## 2017-07-26 MED ORDER — LIDOCAINE HCL (PF) 1 % IJ SOLN
INTRAMUSCULAR | Status: AC
  Administered 2017-07-26: 23:00:00
  Filled 2017-07-26: qty 2

## 2017-07-26 MED ORDER — IBUPROFEN 800 MG PO TABS
800.0000 mg | ORAL_TABLET | Freq: Once | ORAL | Status: AC
  Administered 2017-07-26: 800 mg via ORAL
  Filled 2017-07-26: qty 1

## 2017-07-26 MED ORDER — POVIDONE-IODINE 10 % EX SOLN
CUTANEOUS | Status: DC | PRN
  Filled 2017-07-26: qty 15

## 2017-07-26 NOTE — ED Triage Notes (Signed)
Pt was at work and was placing a "belt back on the conveyer" when he cut his left pointer finger. Bleeding is controlled at this time.

## 2017-07-26 NOTE — ED Provider Notes (Signed)
Face-to-face evaluation   History: Resents for evaluation of a finger injury left index, while at work using a machine consisting of rollers and belts.  He was clearing some trash when his finger got caught and was rapidly pulled upwards, injuring the volar aspect.  He denies sharp injury.  Physical exam: Left index finger.  Examined after digital block.  Circulation intact distally.  He is able to flex both the DIP and PIP joints.  There are large volar laceration somewhat irregular, and gaping.  At the base the wound is visible flexor tendon structures, which appear intact.  There is moderate swelling of the finger consistent with crushing injury.  There does not appear to be any nonviable tissue.  MDM-wound is amenable to closure by ED provider staff, in order to cover the deep structures.  I doubt significant injury to tendon, nerve or debris.   Marland Kitchen.Laceration Repair Date/Time: 07/27/2017 12:46 AM Performed by: Mancel Bale, MD Authorized by: Mancel Bale, MD   Consent:    Consent obtained:  Verbal   Consent given by:  Patient   Risks discussed:  Infection, pain, poor cosmetic result, need for additional repair and poor wound healing   Alternatives discussed:  No treatment, delayed treatment and observation Anesthesia (see MAR for exact dosages):    Anesthesia method:  Local infiltration and nerve block   Local anesthetic:  Lidocaine 1% w/o epi   Block needle gauge:  24 G   Block anesthetic:  Bupivacaine 0.25% w/o epi   Block injection procedure:  Anatomic landmarks identified, introduced needle and anatomic landmarks palpated   Block outcome:  Anesthesia achieved Laceration details:    Location: left index.   Length (cm):  10.5   Depth (mm):  1.2 Repair type:    Repair type:  Complex Pre-procedure details:    Preparation:  Patient was prepped and draped in usual sterile fashion Exploration:    Hemostasis achieved with:  Direct pressure   Wound exploration: wound explored  through full range of motion and entire depth of wound probed and visualized     Wound extent: no nerve damage noted, no tendon damage noted, no underlying fracture noted and no vascular damage noted     Contaminated: no   Treatment:    Area cleansed with:  Betadine and saline   Amount of cleaning:  Standard   Irrigation solution:  Sterile saline   Irrigation method:  Syringe   Visualized foreign bodies/material removed: no     Debridement:  None   Undermining:  None   Scar revision: no   Skin repair:    Repair method:  Sutures   Suture size:  3-0   Suture material:  Prolene   Suture technique:  Vertical mattress and simple interrupted   Number of sutures:  12 Approximation:    Approximation:  Loose Post-procedure details:    Dressing:  Non-adherent dressing, bulky dressing and sterile dressing (Xeroform applied to wound)   Patient tolerance of procedure:  Tolerated well, no immediate complications Comments:     Wound closed loosely based on mechanism, and condition of hand with moderate swelling of wound edges.  There is no appreciable skin loss and the wound edges were lined up appropriately with the irregular edges of the wound.  Patient advised to elevate to decrease pain and bleeding.     Medical screening examination/treatment/procedure(s) were conducted as a shared visit with non-physician practitioner(s) and myself.  I personally evaluated the patient during the encounter  Mancel BaleWentz, Din Bookwalter, MD 07/27/17 [phone]

## 2017-07-27 MED ORDER — CEPHALEXIN 500 MG PO CAPS: 500.0000 mg | ORAL_CAPSULE | Freq: Four times a day (QID) | ORAL | 0 refills | Status: DC

## 2017-07-27 MED ORDER — HYDROCODONE-ACETAMINOPHEN 5-325 MG PO TABS: 1.0000 | ORAL_TABLET | ORAL | 0 refills | Status: DC | PRN

## 2017-07-27 MED ORDER — CEPHALEXIN 500 MG PO CAPS
500.0000 mg | ORAL_CAPSULE | Freq: Once | ORAL | Status: AC
  Administered 2017-07-27: 500 mg via ORAL
  Filled 2017-07-27: qty 1

## 2017-07-27 NOTE — Discharge Instructions (Addendum)
Return Friday for a recheck.  Keep your wound clean and dry.

## 2017-07-27 NOTE — ED Notes (Signed)
Pt ambulatory to lab waiting room for workmans comp urine collection. Pt verbalized understanding of discharge instructions.

## 2017-07-27 NOTE — ED Provider Notes (Addendum)
[facility] EMERGENCY DEPARTMENT Provider Note   CSN: [id number] Arrival date & time: 07/26/17  2101     History   Chief Complaint Chief Complaint  Patient presents with  . Laceration    HPI Donald Wagner is a 32 y.o. right handed male presenting with a crush injury to his left next finger occurring just prior to arrival at work.  He caught his finger between a belt and a roller of a machine at work causing injury.  He he denies numbness in his fingertip.  There is moderate bleeding from the wound which is controlled with pressure application.    The history is provided by the patient.    Past Medical History:  Diagnosis Date  . Acid reflux     Patient Active Problem List   Diagnosis Date Noted  . Acid reflux     History reviewed. No pertinent surgical history.      Home Medications    Prior to Admission medications   Medication Sig Start Date End Date Taking? Authorizing Provider  cephALEXin (KEFLEX) 500 MG capsule Take 1 capsule (500 mg total) by mouth 4 (four) times daily. 07/27/17   Brayten Komar, Raynelle Fanning, PA-C  guaiFENesin (MUCINEX) 600 MG 12 hr tablet Take 600 mg by mouth 2 (two) times daily as needed for cough.    [provider]  HYDROcodone-acetaminophen (NORCO/VICODIN) 5-325 MG tablet Take 1 tablet by mouth every 4 (four) hours as needed. 07/27/17   Burgess Amor, PA-C  HYDROcodone-acetaminophen (NORCO/VICODIN) 5-325 MG tablet Take 1 tablet by mouth every 4 (four) hours as needed. 07/27/17   Burgess Amor, PA-C  ibuprofen (ADVIL,MOTRIN) 600 MG tablet Take 1 tablet (600 mg total) by mouth every 6 (six) hours as needed. 07/27/15   Derwood Kaplan, MD  omeprazole (PRILOSEC) 40 MG capsule Take 1 capsule (40 mg total) by mouth daily. Patient not taking: Reported on 07/27/2015 12/07/13   Ward, Layla Maw, DO    Family History No family history on file.  Social History Social History   Tobacco Use  . Smoking status: Never Smoker  . Smokeless tobacco: Never Used   Substance Use Topics  . Alcohol use: No  . Drug use: No     Allergies   Bee venom and Lactose intolerance (gi)   Review of Systems Review of Systems  Constitutional: Negative for fever.  Musculoskeletal: Positive for arthralgias. Negative for myalgias.  Skin: Positive for wound.  Neurological: Negative for weakness and numbness.     Physical Exam Updated Vital Signs BP (!) 141/100 (BP Location: Right Arm)   Pulse 82   Temp 98.4 F (36.9 C) (Oral)   Resp 17   Ht 5\' 9"  (1.753 m)   Wt 115.7 kg (255 lb)   SpO2 100%   BMI 37.66 kg/m   Physical Exam  Constitutional: He is oriented to person, place, and time. He appears well-developed and well-nourished.  HENT:  Head: Normocephalic.  Cardiovascular: Normal rate.  Pulmonary/Chest: Effort normal.  Musculoskeletal: He exhibits edema and tenderness. He exhibits no deformity.  Neurological: He is alert and oriented to person, place, and time. No sensory deficit.  Skin: Laceration noted.  Irregular deep tearing laceration volar left index finger extending from MCP joint through the proximal distal phalanx.  Tissue layers appear viable but is very regular.  There is a proximal flexor tendon visible but appears intact.  He has full range of motion with both flexion and extension of the MCP and PIP.  He has moderate  range of motion of the DIP,  limited secondary to pain. Less than 2 sec cap refill in fingertip.     ED Treatments / Results  Labs (all labs ordered are listed, but only abnormal results are displayed) Labs Reviewed - No data to display  EKG None  Radiology Dg Finger Index Left  Result Date: 07/26/2017 CLINICAL DATA:  Laceration of left index finger EXAM: LEFT INDEX FINGER 2+V COMPARISON:  None. FINDINGS: Oblique soft tissue laceration along the ulnar volar aspect of the proximal left index finger. No underlying bone involvement, joint dislocation or fracture. No radiopaque foreign body. IMPRESSION: Soft tissue  laceration of the proximal left index finger without apparent osseous involvement. Electronically Signed   By: Tollie Ethavid  Kwon M.D.   On: 07/26/2017 22:04    Procedures Procedures (including critical care time)  Medications Ordered in ED Medications  povidone-iodine (BETADINE) 10 % external solution (has no administration in time range)  bupivacaine (MARCAINE) 0.5 % injection 10 mL (has no administration in time range)  lidocaine (XYLOCAINE) 2 % injection 5 mL (5 mLs Other Given 07/26/17 2237)  ibuprofen (ADVIL,MOTRIN) tablet 800 mg (800 mg Oral Given 07/26/17 2237)  lidocaine (PF) (XYLOCAINE) 1 % injection (  Given 07/26/17 2238)  cephALEXin (KEFLEX) capsule 500 mg (500 mg Oral Given 07/27/17 0103)     Initial Impression / Assessment and Plan / ED Course  I have reviewed the triage vital signs and the nursing notes.  Pertinent labs & imaging results that were available during my care of the patient were reviewed by me and considered in my medical decision making (see chart for details).     Procedure performed by Dr Effie ShyWentz. Refer to his procedure note.  Pt advised recheck here in 2 days. Elevation, ice.  Pt placed on keflex, hydrocodone given for pain control.  Asked pt to return early in the day in the event wound needs any intervention by hand specialist.  Pt understands and agrees with plan.  Final Clinical Impressions(s) / ED Diagnoses   Final diagnoses:  Laceration of left index finger without foreign body without damage to nail, initial encounter    ED Discharge Orders        Ordered    cephALEXin (KEFLEX) 500 MG capsule  4 times daily     07/27/17 0046    HYDROcodone-acetaminophen (NORCO/VICODIN) 5-325 MG tablet  Every 4 hours PRN     07/27/17 0048    HYDROcodone-acetaminophen (NORCO/VICODIN) 5-325 MG tablet  Every 4 hours PRN     07/27/17 0048       Burgess AmorIdol, Cathline Dowen, PA-C 07/27/17 0129    Burgess AmorIdol, Marylan Glore, PA-C 07/27/17 0140    Mancel BaleWentz, Elliott, MD 07/27/17 [phone]

## 2017-07-28 MED FILL — Hydrocodone-Acetaminophen Tab 5-325 MG: ORAL | Qty: 6 | Status: AC

## 2019-07-15 ENCOUNTER — Emergency Department (HOSPITAL_COMMUNITY)
Admission: EM | Admit: 2019-07-15 | Discharge: 2019-07-15 | Disposition: A | Discharge status: Home / Self Care | Payer: BC Managed Care – PPO | Attending: Emergency Medicine | Admitting: Emergency Medicine

## 2019-07-15 ENCOUNTER — Encounter (HOSPITAL_COMMUNITY): Payer: Self-pay | Admitting: Emergency Medicine

## 2019-07-15 DIAGNOSIS — F17210 Nicotine dependence, cigarettes, uncomplicated: Secondary | ICD-10-CM | POA: Insufficient documentation

## 2019-07-15 DIAGNOSIS — J029 Acute pharyngitis, unspecified: Secondary | ICD-10-CM

## 2019-07-15 MED ORDER — OMEPRAZOLE 40 MG PO CPDR: 40.0000 mg | DELAYED_RELEASE_CAPSULE | Freq: Every day | ORAL | 0 refills | Status: DC

## 2019-07-15 MED ORDER — CEPHALEXIN 500 MG PO CAPS: 500.0000 mg | ORAL_CAPSULE | Freq: Two times a day (BID) | ORAL | 0 refills | Status: DC

## 2019-07-15 NOTE — ED Notes (Signed)
Patient verbalizes understanding of discharge instructions . Opportunity for questions and answers were provided . Armband removed by staff ,Pt discharged from ED. W/C  offered at D/C  and Declined W/C at D/C and was escorted to lobby by RN.  

## 2019-07-15 NOTE — ED Triage Notes (Signed)
Pt. Stated, Im pretty sure I have strep throat again. Ive had it 6 times in 8 months

## 2019-07-15 NOTE — Discharge Instructions (Addendum)
Start Keflex (antibiotic) twice daily for 10 days Start Omeprazole for acid reflux once daily Please avoid any smoking or second hand smoke Follow up with ENT

## 2019-07-15 NOTE — ED Provider Notes (Signed)
[facility] EMERGENCY DEPARTMENT Provider Note   CSN: [id number] Arrival date & time: 07/15/19  2229     History Chief Complaint  Patient presents with  . Sore Throat    Donald Wagner is a 34 y.o. male who presents with a sore throat. He states that he's had strep throat 6 times in the past 8 months. [facility]. He states the last time he went was about 3 weeks ago. Antibiotics will clear up his symptoms however it will soon return. Since Friday he has had a sore throat, felt hot/cold, had right sided ear pressure, and painful swallowing. He also notes that at night he will sometimes have an acidic taste in his throat. He is unsure if symptoms are related to his being around his girlfriend who smokes. He also smokes cigarettes and is a social alcohol drinker.  HPI     Past Medical History:  Diagnosis Date  . Acid reflux     Patient Active Problem List   Diagnosis Date Noted  . Acid reflux     History reviewed. No pertinent surgical history.     No family history on file.  Social History   Tobacco Use  . Smoking status: Current Every Day Smoker  . Smokeless tobacco: Never Used  Substance Use Topics  . Alcohol use: Yes  . Drug use: No    Home Medications Prior to Admission medications   Medication Sig Start Date End Date Taking? Authorizing Provider  cephALEXin (KEFLEX) 500 MG capsule Take 1 capsule (500 mg total) by mouth 4 (four) times daily. 07/27/17   Idol, Almyra Free, PA-C  guaiFENesin (MUCINEX) 600 MG 12 hr tablet Take 600 mg by mouth 2 (two) times daily as needed for cough.    [provider]  HYDROcodone-acetaminophen (NORCO/VICODIN) 5-325 MG tablet Take 1 tablet by mouth every 4 (four) hours as needed. 07/27/17   Evalee Jefferson, PA-C  HYDROcodone-acetaminophen (NORCO/VICODIN) 5-325 MG tablet Take 1 tablet by mouth every 4 (four) hours as needed. 07/27/17   Evalee Jefferson, PA-C  ibuprofen  (ADVIL,MOTRIN) 600 MG tablet Take 1 tablet (600 mg total) by mouth every 6 (six) hours as needed. 07/27/15   Varney Biles, MD  omeprazole (PRILOSEC) 40 MG capsule Take 1 capsule (40 mg total) by mouth daily. Patient not taking: Reported on 07/27/2015 12/07/13   Ward, Delice Bison, DO    Allergies    Bee venom and Lactose intolerance (gi)  Review of Systems   Review of Systems  Constitutional: Positive for chills. Negative for fever.       +hot and cold  HENT: Positive for sore throat.     Physical Exam Updated Vital Signs BP 129/89 (BP Location: Right Arm)   Pulse 90   Temp 98.2 F (36.8 C) (Oral)   Resp 16   Ht 5\' 9"  (1.753 m)   Wt 120.2 kg   SpO2 99%   BMI 39.13 kg/m   Physical Exam Vitals and nursing note reviewed.  Constitutional:      General: He is not in acute distress.    Appearance: Normal appearance. He is well-developed. He is not ill-appearing.  HENT:     Head: Normocephalic and atraumatic.     Mouth/Throat:     Lips: Pink.     Mouth: Mucous membranes are moist.     Pharynx: Pharyngeal swelling and posterior oropharyngeal erythema present.     Tonsils: No tonsillar exudate or tonsillar  abscesses.  Eyes:     General: No scleral icterus.       Right eye: No discharge.        Left eye: No discharge.     Conjunctiva/sclera: Conjunctivae normal.     Pupils: Pupils are equal, round, and reactive to light.  Cardiovascular:     Rate and Rhythm: Normal rate.  Pulmonary:     Effort: Pulmonary effort is normal. No respiratory distress.  Abdominal:     General: There is no distension.  Musculoskeletal:     Cervical back: Normal range of motion.  Skin:    General: Skin is warm and dry.  Neurological:     Mental Status: He is alert and oriented to person, place, and time.  Psychiatric:        Behavior: Behavior normal.     ED Results / Procedures / Treatments   Labs (all labs ordered are listed, but only abnormal results are displayed) Labs Reviewed - No  data to display  EKG None  Radiology No results found.  Procedures Procedures (including critical care time)  Medications Ordered in ED Medications - No data to display  ED Course  I have reviewed the triage vital signs and the nursing notes.  Pertinent labs & imaging results that were available during my care of the patient were reviewed by me and considered in my medical decision making (see chart for details).  34 year old male presents with a recurrent sore throat. He has had strep multiple times. Per chart review it appears he does have multiple positive strep tests and he feels like symptoms are the same today. He has erythema and swelling of the throat on exam. He also endorses reflux symptoms. Since he was recently on PCN will change antibiotics to Keflex and start him on Omeprazole. Advised avoid smoking or being around second hand smoke. Referral to ENT given.  MDM Rules/Calculators/A&P                       Final Clinical Impression(s) / ED Diagnoses Final diagnoses:  Pharyngitis, unspecified etiology    Rx / DC Orders ED Discharge Orders    None       Bethel Born, PA-C 07/15/19 1148    Derwood Kaplan, MD 07/15/19 [phone]

## 2022-07-06 ENCOUNTER — Other Ambulatory Visit: Payer: Self-pay

## 2022-07-06 ENCOUNTER — Emergency Department (HOSPITAL_COMMUNITY): Payer: 59

## 2022-07-06 ENCOUNTER — Encounter (HOSPITAL_COMMUNITY): Payer: Self-pay

## 2022-07-06 ENCOUNTER — Emergency Department (HOSPITAL_COMMUNITY)
Admission: EM | Admit: 2022-07-06 | Discharge: 2022-07-06 | Disposition: A | Discharge status: Home / Self Care | Payer: 59 | Attending: Emergency Medicine | Admitting: Emergency Medicine

## 2022-07-06 DIAGNOSIS — K529 Noninfective gastroenteritis and colitis, unspecified: Secondary | ICD-10-CM | POA: Diagnosis not present

## 2022-07-06 DIAGNOSIS — R112 Nausea with vomiting, unspecified: Secondary | ICD-10-CM

## 2022-07-06 DIAGNOSIS — R1013 Epigastric pain: Secondary | ICD-10-CM | POA: Diagnosis present

## 2022-07-06 LAB — URINALYSIS, ROUTINE W REFLEX MICROSCOPIC
Bacteria, UA: NONE SEEN
Bilirubin Urine: NEGATIVE
Glucose, UA: NEGATIVE mg/dL
Ketones, ur: 80 mg/dL — AB
Leukocytes,Ua: NEGATIVE
Nitrite: NEGATIVE
Protein, ur: 30 mg/dL — AB
Specific Gravity, Urine: 1.021 (ref 1.005–1.030)
pH: 6 (ref 5.0–8.0)

## 2022-07-06 LAB — CBC
HCT: 46.6 % (ref 39.0–52.0)
Hemoglobin: 16.6 g/dL (ref 13.0–17.0)
MCH: 29.3 pg (ref 26.0–34.0)
MCHC: 35.6 g/dL (ref 30.0–36.0)
MCV: 82.2 fL (ref 80.0–100.0)
Platelets: 349 10*3/uL (ref 150–400)
RBC: 5.67 MIL/uL (ref 4.22–5.81)
RDW: 13.2 % (ref 11.5–15.5)
WBC: 10.4 10*3/uL (ref 4.0–10.5)
nRBC: 0 % (ref 0.0–0.2)

## 2022-07-06 LAB — COMPREHENSIVE METABOLIC PANEL
ALT: 18 U/L (ref 0–44)
AST: 19 U/L (ref 15–41)
Albumin: 4.8 g/dL (ref 3.5–5.0)
Alkaline Phosphatase: 83 U/L (ref 38–126)
Anion gap: 16 — ABNORMAL HIGH (ref 5–15)
BUN: 10 mg/dL (ref 6–20)
CO2: 19 mmol/L — ABNORMAL LOW (ref 22–32)
Calcium: 10.2 mg/dL (ref 8.9–10.3)
Chloride: 99 mmol/L (ref 98–111)
Creatinine, Ser: 0.9 mg/dL (ref 0.61–1.24)
GFR, Estimated: >60 mL/min (ref >60)
Glucose, Bld: 114 mg/dL — ABNORMAL HIGH (ref 70–99)
Potassium: 3.5 mmol/L (ref 3.5–5.1)
Sodium: 134 mmol/L — ABNORMAL LOW (ref 135–145)
Total Bilirubin: 2.3 mg/dL — ABNORMAL HIGH (ref 0.3–1.2)
Total Protein: 8.7 g/dL — ABNORMAL HIGH (ref 6.5–8.1)

## 2022-07-06 LAB — LIPASE, BLOOD: Lipase: 34 U/L (ref 11–51)

## 2022-07-06 LAB — TROPONIN I (HIGH SENSITIVITY): Troponin I (High Sensitivity): 3 ng/L (ref <18)

## 2022-07-06 LAB — MAGNESIUM: Magnesium: 2.1 mg/dL (ref 1.7–2.4)

## 2022-07-06 MED ORDER — LACTATED RINGERS IV BOLUS
2000.0000 mL | Freq: Once | INTRAVENOUS | Status: AC
  Administered 2022-07-06: 2000 mL via INTRAVENOUS

## 2022-07-06 MED ORDER — ONDANSETRON 4 MG PO TBDP
4.0000 mg | ORAL_TABLET | Freq: Once | ORAL | Status: AC
  Administered 2022-07-06: 4 mg via ORAL
  Filled 2022-07-06: qty 1

## 2022-07-06 MED ORDER — IOHEXOL 350 MG/ML SOLN
75.0000 mL | Freq: Once | INTRAVENOUS | Status: AC | PRN
  Administered 2022-07-06: 75 mL via INTRAVENOUS

## 2022-07-06 MED ORDER — ONDANSETRON 4 MG PO TBDP: 4.0000 mg | ORAL_TABLET | Freq: Three times a day (TID) | ORAL | 0 refills | Status: AC | PRN

## 2022-07-06 MED ORDER — ONDANSETRON HCL 4 MG/2ML IJ SOLN: 4.0000 mg | Freq: Once | INTRAMUSCULAR | Status: DC

## 2022-07-06 MED ORDER — METOCLOPRAMIDE HCL 5 MG/ML IJ SOLN
10.0000 mg | Freq: Once | INTRAMUSCULAR | Status: AC
  Administered 2022-07-06: 10 mg via INTRAVENOUS
  Filled 2022-07-06: qty 2

## 2022-07-06 NOTE — ED Notes (Signed)
Lab will add troponin and magnesium

## 2022-07-06 NOTE — ED Notes (Signed)
Got patient on the monitor patient is resting with call bell in reach and family at bedside °

## 2022-07-06 NOTE — ED Provider Notes (Signed)
[place] EMERGENCY DEPARTMENT AT [facility] Provider Note   CSN: [id number] Arrival date & time: 07/06/22  0865     History  Chief Complaint  Patient presents with   Abdominal Pain   Emesis   Nausea   Diarrhea    Donald Wagner is a 37 y.o. male.   Abdominal Pain Associated symptoms: chest pain, diarrhea, nausea and vomiting   Emesis Associated symptoms: abdominal pain and diarrhea   Diarrhea Associated symptoms: abdominal pain and vomiting   Patient presents for emesis.  Medical history includes GERD.  He last ate 8 PM last night.  1 to 2 hours later, he developed nausea, vomiting, and diarrhea.  Symptoms persisted throughout the night.  He has had p.o. intolerance.  He describes pain in epigastrium that radiates at center of chest.  He took some Dramamine at home.  He was given some Zofran in ED triage.  He has persistent nausea.  Patient denies any similar prior episodes.     Home Medications Prior to Admission medications   Medication Sig Start Date End Date Taking? Authorizing Provider  ondansetron (ZOFRAN-ODT) 4 MG disintegrating tablet Take 1 tablet (4 mg total) by mouth every 8 (eight) hours as needed for nausea or vomiting. 07/06/22  Yes Gloris Manchester, MD      Allergies    Bee venom and Lactose intolerance (gi)    Review of Systems   Review of Systems  Cardiovascular:  Positive for chest pain.  Gastrointestinal:  Positive for abdominal pain, diarrhea, nausea and vomiting.  All other systems reviewed and are negative.   Physical Exam Updated Vital Signs BP (!) 155/81 (BP Location: Right Arm)   Pulse (!) 58   Temp 98 F (36.7 C) (Oral)   Resp (!) 23   Ht 5\' 9"  (1.753 m)   Wt 113.9 kg   SpO2 100%   BMI 37.07 kg/m  Physical Exam Vitals and nursing note reviewed.  Constitutional:      General: He is not in acute distress.    Appearance: He is well-developed. He is ill-appearing. He is not toxic-appearing or diaphoretic.  HENT:      Head: Normocephalic and atraumatic.     Mouth/Throat:     Mouth: Mucous membranes are moist.  Eyes:     General: No scleral icterus.    Conjunctiva/sclera: Conjunctivae normal.  Cardiovascular:     Rate and Rhythm: Normal rate and regular rhythm.  Pulmonary:     Effort: Pulmonary effort is normal. No respiratory distress.  Abdominal:     Palpations: Abdomen is soft.     Tenderness: There is abdominal tenderness in the epigastric area. There is no guarding or rebound.  Musculoskeletal:        General: No swelling.     Cervical back: Neck supple.  Skin:    General: Skin is warm and dry.     Coloration: Skin is not cyanotic, jaundiced or pale.  Neurological:     General: No focal deficit present.     Mental Status: He is alert and oriented to person, place, and time.  Psychiatric:        Mood and Affect: Mood normal.        Behavior: Behavior normal.     ED Results / Procedures / Treatments   Labs (all labs ordered are listed, but only abnormal results are displayed) Labs Reviewed  COMPREHENSIVE METABOLIC PANEL - Abnormal; Notable for the following components:      Result  Value   Sodium 134 (*)    CO2 19 (*)    Glucose, Bld 114 (*)    Total Protein 8.7 (*)    Total Bilirubin 2.3 (*)    Anion gap 16 (*)    All other components within normal limits  URINALYSIS, ROUTINE W REFLEX MICROSCOPIC - Abnormal; Notable for the following components:   Hgb urine dipstick SMALL (*)    Ketones, ur 80 (*)    Protein, ur 30 (*)    All other components within normal limits  LIPASE, BLOOD  CBC  MAGNESIUM  TROPONIN I (HIGH SENSITIVITY)    EKG EKG Interpretation  Date/Time:  Wednesday Jul 06 2022 07:21:35 EDT Ventricular Rate:  65 PR Interval:  174 QRS Duration: 88 QT Interval:  414 QTC Calculation: 430 R Axis:   60 Text Interpretation: Normal sinus rhythm with sinus arrhythmia Minimal voltage criteria for LVH, may be normal variant ( Sokolow-Lyon ) Confirmed by Gloris Manchester ([id number])  on 07/06/2022 9:46:21 AM  Radiology CT ABDOMEN PELVIS W CONTRAST  Result Date: 07/06/2022 CLINICAL DATA:  Stomach pain associated with nausea, recurrent vomiting, and diarrhea EXAM: CT ABDOMEN AND PELVIS WITH CONTRAST TECHNIQUE: Multidetector CT imaging of the abdomen and pelvis was performed using the standard protocol following bolus administration of intravenous contrast. RADIATION DOSE REDUCTION: This exam was performed according to the departmental dose-optimization program which includes automated exposure control, adjustment of the mA and/or kV according to patient size and/or use of iterative reconstruction technique. CONTRAST:  75mL OMNIPAQUE IOHEXOL 350 MG/ML SOLN COMPARISON:  CT abdomen and pelvis dated 07/02/2015 FINDINGS: Lower chest: No focal consolidation or pulmonary nodule in the lung bases. No pleural effusion or pneumothorax demonstrated. Partially imaged heart size is normal. Hepatobiliary: Subcentimeter hypoattenuating focus in segment 4 at the dome (3:8), too small to characterize. No intra or extrahepatic biliary ductal dilation. Normal gallbladder. Pancreas: No focal lesions or main ductal dilation. Spleen: Normal in size without focal abnormality. Adrenals/Urinary Tract: No adrenal nodules. No suspicious renal mass, calculi or hydronephrosis. No focal bladder wall thickening. Stomach/Bowel: Normal appearance of the stomach. Mild mural thickening of the ascending colon with pericolonic stranding. Normal appendix. Vascular/Lymphatic: No significant vascular findings are present. No enlarged abdominal or pelvic lymph nodes. Reproductive: Prostate is unremarkable. Other: No free fluid, fluid collection, or free air. Musculoskeletal: No acute or abnormal lytic or blastic osseous lesions. Unchanged subcutaneous hypoattenuating focus in the midline back at the level of T11-12 measures 1.1 x 0.9 cm (3:18), likely inclusion cyst. IMPRESSION: Mild mural thickening of the ascending colon with  pericolonic stranding, suggestive of colitis. Electronically Signed   By: Agustin Cree M.D.   On: 07/06/2022 10:39   DG Chest Portable 1 View  Result Date: 07/06/2022 CLINICAL DATA:  One day history of abdominal pain and chest tightness associated with nausea, vomiting, and diarrhea EXAM: PORTABLE CHEST 1 VIEW COMPARISON:  Chest radiograph dated 12/07/2013 FINDINGS: Normal lung volumes. No focal consolidations. No pleural effusion or pneumothorax. Enlarged cardiomediastinal silhouette is likely projectional. no acute osseous abnormality. IMPRESSION: No active disease. Electronically Signed   By: Agustin Cree M.D.   On: 07/06/2022 08:29    Procedures Procedures    Medications Ordered in ED Medications  ondansetron (ZOFRAN-ODT) disintegrating tablet 4 mg (4 mg Oral Given 07/06/22 0750)  lactated ringers bolus 2,000 mL (0 mLs Intravenous Stopped 07/06/22 1020)  metoCLOPramide (REGLAN) injection 10 mg (10 mg Intravenous Given 07/06/22 0812)  iohexol (OMNIPAQUE) 350 MG/ML injection 75 mL (75 mLs  Intravenous Contrast Given 07/06/22 1020)    ED Course/ Medical Decision Making/ A&P                             Medical Decision Making Amount and/or Complexity of Data Reviewed Labs: ordered. Radiology: ordered.  Risk Prescription drug management.   This patient presents to the ED for concern of nausea, vomiting, diarrhea, this involves an extensive number of treatment options, and is a complaint that carries with it a high risk of complications and morbidity.  The differential diagnosis includes gastroenteritis, colitis, cholecystitis, GERD   Co morbidities that complicate the patient evaluation  GERD   Additional history obtained:  Additional history obtained from N/A External records from outside source obtained and reviewed including EMR   Lab Tests:  I Ordered, and personally interpreted labs.  The pertinent results include: Ketonuria secondary to starvation ketosis; normal  electrolytes, normal kidney function, normal hepatobiliary enzymes, no leukocytosis, normal lipase, normal troponin   Imaging Studies ordered:  I ordered imaging studies including CT of abdomen and pelvis I independently visualized and interpreted imaging which showed mild colonic wall thickening of ascending colon. I agree with the radiologist interpretation   Cardiac Monitoring: / EKG:  The patient was maintained on a cardiac monitor.  I personally viewed and interpreted the cardiac monitored which showed an underlying rhythm of: Sinus rhythm   Problem List / ED Course / Critical interventions / Medication management  Patient presents for nausea, vomiting, diarrhea, abdominal pain, and p.o. intolerance since last night.  Vital signs on arrival notable for tachypnea.  On exam, patient's abdomen is soft.  He endorses some mild epigastric tenderness to deep palpation.  Pain radiates up center of chest.  EKG shows normal sinus rhythm without evidence of ST segment abnormality.  Reglan was given for persistent nausea.  2 L of IV fluid were ordered given his fluid losses and p.o. intolerance.  Laboratory workup was initiated.  Patient had improved symptoms after IV fluids and Reglan.  Workup results are reassuring.  Patient was able to tolerate p.o. intake in the ED without difficulty.  He was prescribed Zofran to take as needed at home.  He was discharged in good condition. I ordered medication including IV fluids for hydration; Reglan for nausea Reevaluation of the patient after these medicines showed that the patient improved I have reviewed the patients home medicines and have made adjustments as needed   Social Determinants of Health:  Lives independently        Final Clinical Impression(s) / ED Diagnoses Final diagnoses:  Nausea and vomiting, unspecified vomiting type  Gastroenteritis  Colitis    Rx / DC Orders ED Discharge Orders          Ordered    ondansetron  (ZOFRAN-ODT) 4 MG disintegrating tablet  Every 8 hours PRN        07/06/22 1046              Gloris Manchester, MD 07/06/22 1058

## 2022-07-06 NOTE — ED Notes (Signed)
Patient verbalizes understanding of discharge instructions. Opportunity for questioning and answers were provided. Pt discharged from ED. 

## 2022-07-06 NOTE — Discharge Instructions (Signed)
Continue to stay hydrated at home.  If you have return of nausea and vomiting, a prescription for medication called Zofran was sent to your pharmacy.  Take this as needed for nausea.  Return to emergency department for any new or worsening symptoms of concern.

## 2022-07-06 NOTE — ED Triage Notes (Signed)
Pt. Stated, I think I ate too much and it was bad. Ive had N/V/D and stomach pain since last night. Ive threw up about 9-10 times.
